# Patient Record
Sex: Male | Born: 1950 | Race: White | Hispanic: No | Marital: Single | Smoking: Former smoker
Health system: Southern US, Community
[De-identification: ages and names within clinical notes are randomized; demographics above are authoritative.]

## PROBLEM LIST (undated history)

## (undated) DIAGNOSIS — E7800 Pure hypercholesterolemia, unspecified: Secondary | ICD-10-CM

## (undated) DIAGNOSIS — J302 Other seasonal allergic rhinitis: Secondary | ICD-10-CM

---

## 2008-08-14 ENCOUNTER — Ambulatory Visit: Payer: Self-pay | Admitting: Unknown Physician Specialty

## 2011-02-22 ENCOUNTER — Ambulatory Visit: Payer: Self-pay | Admitting: Unknown Physician Specialty

## 2011-03-06 ENCOUNTER — Ambulatory Visit: Payer: Self-pay | Admitting: Unknown Physician Specialty

## 2011-04-05 ENCOUNTER — Ambulatory Visit: Payer: Self-pay | Admitting: Unknown Physician Specialty

## 2011-05-06 ENCOUNTER — Ambulatory Visit: Payer: Self-pay | Admitting: Unknown Physician Specialty

## 2011-06-05 ENCOUNTER — Ambulatory Visit: Payer: Self-pay | Admitting: Unknown Physician Specialty

## 2014-11-21 ENCOUNTER — Ambulatory Visit: Payer: Self-pay | Admitting: Internal Medicine

## 2015-04-14 ENCOUNTER — Encounter: Payer: Self-pay | Admitting: Emergency Medicine

## 2015-04-14 ENCOUNTER — Ambulatory Visit: Payer: BLUE CROSS/BLUE SHIELD

## 2015-04-14 ENCOUNTER — Ambulatory Visit
Admission: EM | Admit: 2015-04-14 | Discharge: 2015-04-14 | Disposition: A | Discharge status: Home / Self Care | Payer: BLUE CROSS/BLUE SHIELD | Attending: Family Medicine | Admitting: Family Medicine

## 2015-04-14 DIAGNOSIS — M545 Low back pain: Secondary | ICD-10-CM | POA: Diagnosis not present

## 2015-04-14 DIAGNOSIS — F17210 Nicotine dependence, cigarettes, uncomplicated: Secondary | ICD-10-CM | POA: Diagnosis not present

## 2015-04-14 DIAGNOSIS — S335XXA Sprain of ligaments of lumbar spine, initial encounter: Secondary | ICD-10-CM | POA: Diagnosis not present

## 2015-04-14 HISTORY — DX: Other seasonal allergic rhinitis: J30.2

## 2015-04-14 MED ORDER — KETOROLAC TROMETHAMINE 60 MG/2ML IM SOLN
60.0000 mg | Freq: Once | INTRAMUSCULAR | Status: AC
  Administered 2015-04-14: 60 mg via INTRAMUSCULAR

## 2015-04-14 MED ORDER — NAPROXEN 500 MG PO TABS: 500.0000 mg | ORAL_TABLET | Freq: Two times a day (BID) | ORAL | Status: AC

## 2015-04-14 MED ORDER — CYCLOBENZAPRINE HCL 10 MG PO TABS: 10.0000 mg | ORAL_TABLET | Freq: Three times a day (TID) | ORAL | Status: AC

## 2015-04-14 NOTE — Discharge Instructions (Signed)
Back Exercises °Back exercises help treat and prevent back injuries. The goal of back exercises is to increase the strength of your abdominal and back muscles and the flexibility of your back. These exercises should be started when you no longer have back pain. Back exercises include: °· Pelvic Tilt. Lie on your back with your knees bent. Tilt your pelvis until the lower part of your back is against the floor. Hold this position 5 to 10 sec and repeat 5 to 10 times. °· Knee to Chest. Pull first 1 knee up against your chest and hold for 20 to 30 seconds, repeat this with the other knee, and then both knees. This may be done with the other leg straight or bent, whichever feels better. °· Sit-Ups or Curl-Ups. Bend your knees 90 degrees. Start with tilting your pelvis, and do a partial, slow sit-up, lifting your trunk only 30 to 45 degrees off the floor. Take at least 2 to 3 seconds for each sit-up. Do not do sit-ups with your knees out straight. If partial sit-ups are difficult, simply do the above but with only tightening your abdominal muscles and holding it as directed. °· Hip-Lift. Lie on your back with your knees flexed 90 degrees. Push down with your feet and shoulders as you raise your hips a couple inches off the floor; hold for 10 seconds, repeat 5 to 10 times. °· Back arches. Lie on your stomach, propping yourself up on bent elbows. Slowly press on your hands, causing an arch in your low back. Repeat 3 to 5 times. Any initial stiffness and discomfort should lessen with repetition over time. °· Shoulder-Lifts. Lie face down with arms beside your body. Keep hips and torso pressed to floor as you slowly lift your head and shoulders off the floor. °Do not overdo your exercises, especially in the beginning. Exercises may cause you some mild back discomfort which lasts for a few minutes; however, if the pain is more severe, or lasts for more than 15 minutes, do not continue exercises until you see your caregiver.  Improvement with exercise therapy for back problems is slow.  °See your caregivers for assistance with developing a proper back exercise program. °Document Released: 12/29/2004 Document Revised: 02/13/2012 Document Reviewed: 09/22/2011 °ExitCare® Patient Information ©2015 [employer]. This information is not intended to replace advice given to you by your health care provider. Make sure you discuss any questions you have with your health care provider. ° °Back Pain, Adult °Back pain is very common. The pain often gets better over time. The cause of back pain is usually not dangerous. Most people can learn to manage their back pain on their own.  °HOME CARE  °· Stay active. Start with short walks on flat ground if you can. Try to walk farther each day. °· Do not sit, drive, or stand in one place for more than 30 minutes. Do not stay in bed. °· Do not avoid exercise or work. Activity can help your back heal faster. °· Be careful when you bend or lift an object. Bend at your knees, keep the object close to you, and do not twist. °· Sleep on a firm mattress. Lie on your side, and bend your knees. If you lie on your back, put a pillow under your knees. °· Only take medicines as told by your doctor. °· Put ice on the injured area. °¨ Put ice in a plastic bag. °¨ Place a towel between your skin and the bag. °¨ Leave the   ice on for 15-20 minutes, 03-04 times a day for the first 2 to 3 days. After that, you can switch between ice and heat packs. °· Ask your doctor about back exercises or massage. °· Avoid feeling anxious or stressed. Find good ways to deal with stress, such as exercise. °GET HELP RIGHT AWAY IF:  °· Your pain does not go away with rest or medicine. °· Your pain does not go away in 1 week. °· You have new problems. °· You do not feel well. °· The pain spreads into your legs. °· You cannot control when you poop (bowel movement) or pee (urinate). °· Your arms or legs feel weak or lose feeling  (numbness). °· You feel sick to your stomach (nauseous) or throw up (vomit). °· You have belly (abdominal) pain. °· You feel like you may pass out (faint). °MAKE SURE YOU:  °· Understand these instructions. °· Will watch your condition. °· Will get help right away if you are not doing well or get worse. °Document Released: 05/09/2008 Document Revised: 02/13/2012 Document Reviewed: 03/25/2014 °ExitCare® Patient Information ©2015 [employer]. This information is not intended to replace advice given to you by your health care provider. Make sure you discuss any questions you have with your health care provider. ° °

## 2015-04-14 NOTE — ED Notes (Signed)
Patient c/o lower back pain since Thursday of last week.  Patient denies injury.

## 2015-04-14 NOTE — ED Provider Notes (Addendum)
CSN: [id number]     Arrival date & time 04/14/15  8466 History   First MD Initiated Contact with Patient 04/14/15 1010     Chief Complaint  Patient presents with  . Back Pain   (Consider location/radiation/quality/duration/timing/severity/associated sxs/prior Treatment) HPI  this is a 64 year old male who presents with intermittent left-sided low back pain that first occurred on Thursday 4 days prior to this visit. 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 He has initial back pain on Friday for which he took 1 dose of ibuprofen and felt "fine for 2 days". On Sunday he began to again feel the pain this time radiating into his left buttocks. He took ibuprofen again but did not help. The pain has progressed and yesterday was the worst pain he has experienced. He's had several episodes of a lightning type pain in his posterior thigh to the level of his knee. He denies any symptoms below his knee. He denies any incontinence. He has had previous back pain episodes  which resolved with a few days of rest. Past Medical History  Diagnosis Date  . Seasonal allergies    History reviewed. No pertinent past surgical history. History reviewed. No pertinent family history. History  Substance Use Topics  . Smoking status: Current Every Day Smoker    Types: Cigarettes  . Smokeless tobacco: Never Used  . Alcohol Use: Yes    Review of Systems  Constitutional: Positive for activity change.  Musculoskeletal: Positive for myalgias and back pain.  All other systems reviewed and are negative.   Allergies  Review of patient's allergies indicates no known allergies.  Home Medications   Prior to Admission medications   Medication Sig Start Date End Date Taking? Authorizing Provider  loratadine (CLARITIN) 10 MG tablet Take 10 mg by mouth daily.   Yes  Historical Provider, MD  cyclobenzaprine (FLEXERIL) 10 MG tablet Take 1 tablet (10 mg total) by mouth 3 (three) times daily. 04/14/15   Lorin Picket, PA-C  naproxen (NAPROSYN) 500 MG tablet Take 1 tablet (500 mg total) by mouth 2 (two) times daily with a meal. 04/14/15   Lorin Picket, PA-C   BP 148/88 mmHg  Pulse 68  Temp(Src) 96.3 F (35.7 C) (Tympanic)  Resp 16  Ht 5\' 9"  (1.753 m)  Wt 240 lb (108.863 kg)  BMI 35.43 kg/m2  SpO2 96% Physical Exam  Constitutional: He is oriented to person, place, and time. He appears well-developed and well-nourished.  HENT:  Head: Normocephalic and atraumatic.  Eyes: EOM are normal. Pupils are equal, round, and reactive to light.  Neck: Normal range of motion. Neck supple.  Musculoskeletal:  Examination lumbar spine shows a level pelvis in stance. He has a definite right list and a marked right takeoff of the lumbar spine. Forward flexion and right lateral flexion are normal liver left lateral flexion is markedly blunted. Is able to toe and heel walk adequately. DTRs are brisk 3+ over 4 bilaterally symmetrical. Sensation is intact to light touch in the lower extremities. Straight leg raise testing in the sitting position is negative to 90. EHL anterior tibialis and peroneal are strong. There is tenderness to palpation in the left paraspinous muscles in the lower segments as well as in the left sciatic notch.  Neurological: He is alert and oriented to person,  place, and time. He has normal reflexes.  Skin: Skin is warm and dry.  Psychiatric: He has a normal mood and affect. His behavior is normal. Judgment and thought content normal.    ED Course  Procedures (including critical care time) Labs Review Labs Reviewed - No data to display  Imaging Review No results found. Medications  ketorolac (TORADOL) injection 60 mg (60 mg Intramuscular Given 04/14/15 1040)    MDM   1. Lumbar back sprain, initial encounter     I discussed with the  patient the findings of the x-rays. I advised him that I'll place him on the above work restrictions. 2 weeks. Medications have been prescribed. Is also advised that it may require physical therapy or even consideration of chiropractic care. Since he does not have a primary care physician at the present time he may return here for follow-up. Is currently in the process of changing insurance companies and will be established with [facility] at that time.       Lorin Picket, PA-C 04/14/15 [address] [place], [place] 05/07/15 1432

## 2016-06-09 ENCOUNTER — Encounter: Payer: Self-pay | Admitting: *Deleted

## 2016-06-09 NOTE — Progress Notes (Signed)
This encounter was created in error - please disregard.

## 2020-12-21 ENCOUNTER — Ambulatory Visit
Admission: EM | Admit: 2020-12-21 | Discharge: 2020-12-21 | Disposition: A | Discharge status: Home / Self Care | Payer: 59 | Attending: Physician Assistant | Admitting: Physician Assistant

## 2020-12-21 ENCOUNTER — Other Ambulatory Visit: Payer: Self-pay

## 2020-12-21 ENCOUNTER — Ambulatory Visit (INDEPENDENT_AMBULATORY_CARE_PROVIDER_SITE_OTHER): Payer: 59

## 2020-12-21 DIAGNOSIS — R197 Diarrhea, unspecified: Secondary | ICD-10-CM | POA: Diagnosis not present

## 2020-12-21 DIAGNOSIS — Z8616 Personal history of COVID-19: Secondary | ICD-10-CM

## 2020-12-21 DIAGNOSIS — U071 COVID-19: Secondary | ICD-10-CM | POA: Diagnosis not present

## 2020-12-21 LAB — COMPREHENSIVE METABOLIC PANEL
ALT: 26 U/L (ref 0–44)
AST: 25 U/L (ref 15–41)
Albumin: 3.8 g/dL (ref 3.5–5.0)
Alkaline Phosphatase: 64 U/L (ref 38–126)
Anion gap: 14 (ref 5–15)
BUN: 23 mg/dL (ref 8–23)
CO2: 26 mmol/L (ref 22–32)
Calcium: 9 mg/dL (ref 8.9–10.3)
Chloride: 101 mmol/L (ref 98–111)
Creatinine, Ser: 1.39 mg/dL — ABNORMAL HIGH (ref 0.61–1.24)
GFR, Estimated: 55 mL/min — ABNORMAL LOW (ref >60)
Glucose, Bld: 119 mg/dL — ABNORMAL HIGH (ref 70–99)
Potassium: 3.7 mmol/L (ref 3.5–5.1)
Sodium: 141 mmol/L (ref 135–145)
Total Bilirubin: 0.4 mg/dL (ref 0.3–1.2)
Total Protein: 8 g/dL (ref 6.5–8.1)

## 2020-12-21 LAB — CBC WITH DIFFERENTIAL/PLATELET
Abs Immature Granulocytes: 0.04 10*3/uL (ref 0.00–0.07)
Basophils Absolute: 0 10*3/uL (ref 0.0–0.1)
Basophils Relative: 0 %
Eosinophils Absolute: 0.2 10*3/uL (ref 0.0–0.5)
Eosinophils Relative: 3 %
HCT: 49.6 % (ref 39.0–52.0)
Hemoglobin: 16.7 g/dL (ref 13.0–17.0)
Immature Granulocytes: 0 %
Lymphocytes Relative: 14 %
Lymphs Abs: 1.3 10*3/uL (ref 0.7–4.0)
MCH: 31.6 pg (ref 26.0–34.0)
MCHC: 33.7 g/dL (ref 30.0–36.0)
MCV: 93.9 fL (ref 80.0–100.0)
Monocytes Absolute: 1.2 10*3/uL — ABNORMAL HIGH (ref 0.1–1.0)
Monocytes Relative: 13 %
Neutro Abs: 6.4 10*3/uL (ref 1.7–7.7)
Neutrophils Relative %: 70 %
Platelets: 291 10*3/uL (ref 150–400)
RBC: 5.28 MIL/uL (ref 4.22–5.81)
RDW: 12.8 % (ref 11.5–15.5)
WBC: 9.2 10*3/uL (ref 4.0–10.5)
nRBC: 0 % (ref 0.0–0.2)

## 2020-12-21 MED ORDER — DIPHENOXYLATE-ATROPINE 2.5-0.025 MG PO TABS: 1.0000 | ORAL_TABLET | Freq: Four times a day (QID) | ORAL | 0 refills | Status: DC | PRN

## 2020-12-21 NOTE — ED Triage Notes (Signed)
Pt with diarrhea post COVID last week. States the stool has solid and loose components. No nausea or vomiting. Eating and drinking mostly normally.

## 2020-12-21 NOTE — Discharge Instructions (Signed)
Your chest x-ray is within normal limits.  There is no evidence of pneumonia.  We also performed some lab tests which are reassuring.  As discussed, your kidney function is slightly decreased from what it was 2 months ago when you had your labs checked your primary care office.  I will follow up with them once you are feeling better to have this lab rechecked.  Rest and increase fluid intake.  I have sent Lomotil to the pharmacy for diarrhea.  If you have any fever, dark tarry stools, blood in the stool, abdominal pain, nausea/vomiting or weakness, follow back up with her department to go to emergency room.  As discussed, there are many different causes for diarrhea including viral infection such as COVID-19 since he recently had that.  There are also other causes which can include bacterial or parasitic infections, and abdominal infections.  It is most likely symptoms are related to recent COVID-19 infection.

## 2020-12-21 NOTE — ED Provider Notes (Signed)
[facility]    CSN: [id number] Arrival date & time: 12/21/20  1312      History   Chief Complaint Chief Complaint  Patient presents with  . Diarrhea    HPI Adam Brewer is a 70 y.o. male presenting for concerns about diarrhea. Patient states that he was diagnosed with COVID 19 11 days ago.  He states that his symptoms were not that bad.  He admitted to a mild cough but states that he has not had a continued cough.  Patient states that about 5 to 6 days ago he started having diarrhea.  Patient states that initially he was having loose and watery stools every hour.  He states that in the past 24 hours he has had about 10 loose stools.  Patient denies any dark black stools or blood in the stool.  He denies any associated abdominal pain, nausea/vomiting.  He says he has been tolerating food and fluids, states he had 2 egg sandwiches today. Denies any fevers or fatigue.  He says that he took Imodium a couple of times but has not taken it recently.  Patient says he supposed to go back to work tomorrow and does not know if he should.  Patient denies any history of GI problems.  Denies any recent antibiotic use or travel.  Patient denies any weakness, headaches, congestion, chest pain, shortness of breath, back pain.  Has not taken any other over-the-counter medication for symptoms.  PMH significant for smoking >50 years, quit 1 year ago.  Patient denies any history of cardiopulmonary disease.  He says he is otherwise healthy.  No other complaints or concerns.  HPI  Past Medical History:  Diagnosis Date  . Seasonal allergies     There are no problems to display for this patient.   History reviewed. No pertinent surgical history.     Home Medications    Prior to Admission medications   Medication Sig Start Date End Date Taking? Authorizing Provider  diphenoxylate-atropine (LOMOTIL) 2.5-0.025 MG tablet Take 1 tablet by mouth 4 (four) times daily as needed for up to 5  days for diarrhea or loose stools. 12/21/20 12/26/20 Yes Danton Clap, PA-C  cyclobenzaprine (FLEXERIL) 10 MG tablet Take 1 tablet (10 mg total) by mouth 3 (three) times daily. 04/14/15   Lorin Picket, PA-C  loratadine (CLARITIN) 10 MG tablet Take 10 mg by mouth daily.    [provider]  naproxen (NAPROSYN) 500 MG tablet Take 1 tablet (500 mg total) by mouth 2 (two) times daily with a meal. 04/14/15   Lorin Picket, PA-C    Family History History reviewed. No pertinent family history.  Social History Social History   Tobacco Use  . Smoking status: Former Smoker    Types: Cigarettes  . Smokeless tobacco: Never Used  Substance Use Topics  . Alcohol use: Yes    Comment: social  . Drug use: No     Allergies   Patient has no known allergies.   Review of Systems Review of Systems  Constitutional: Negative for activity change, appetite change, fatigue and fever.  HENT: Negative for congestion, rhinorrhea and sore throat.   Respiratory: Negative for cough and shortness of breath.   Cardiovascular: Negative for chest pain.  Gastrointestinal: Positive for diarrhea. Negative for abdominal pain, anal bleeding, blood in stool, constipation, nausea, rectal pain and vomiting.  Genitourinary: Negative for decreased urine volume and dysuria.  Musculoskeletal: Negative for back pain and myalgias.  Skin: Negative  for rash.  Neurological: Negative for weakness, light-headedness and headaches.     Physical Exam Triage Vital Signs ED Triage Vitals  Enc Vitals Group     BP 12/21/20 1411 138/78     Pulse Rate 12/21/20 1411 85     Resp 12/21/20 1411 19     Temp 12/21/20 1411 98.7 F (37.1 C)     Temp Source 12/21/20 1411 Oral     SpO2 12/21/20 1411 94 %     Weight 12/21/20 1409 260 lb (117.9 kg)     Height 12/21/20 1409 5\' 8"  (1.727 m)     Head Circumference --      Peak Flow --      Pain Score 12/21/20 1409 3     Pain Loc --      Pain Edu? --      Excl. in [place]? --     No data found.  Updated Vital Signs BP 138/78 (BP Location: Left Arm)   Pulse 85   Temp 98.7 F (37.1 C) (Oral)   Resp 19   Ht 5\' 8"  (1.727 m)   Wt 260 lb (117.9 kg)   SpO2 94%   BMI 39.53 kg/m       Physical Exam Vitals and nursing note reviewed.  Constitutional:      General: He is not in acute distress.    Appearance: Normal appearance. He is well-developed and well-nourished. He is not ill-appearing, toxic-appearing or diaphoretic.  HENT:     Head: Normocephalic and atraumatic.     Nose: Nose normal.     Mouth/Throat:     Mouth: Mucous membranes are moist.     Pharynx: Oropharynx is clear.  Eyes:     General: No scleral icterus.    Conjunctiva/sclera: Conjunctivae normal.  Cardiovascular:     Rate and Rhythm: Normal rate and regular rhythm.     Heart sounds: Normal heart sounds.  Pulmonary:     Effort: Pulmonary effort is normal. No respiratory distress.     Breath sounds: Normal breath sounds. No wheezing, rhonchi or rales.  Abdominal:     General: Bowel sounds are normal.     Palpations: Abdomen is soft.     Tenderness: There is no abdominal tenderness. There is no guarding.  Musculoskeletal:        General: No edema.     Cervical back: Neck supple.  Skin:    General: Skin is warm and dry.  Neurological:     General: No focal deficit present.     Mental Status: He is alert. Mental status is at baseline.     Motor: No weakness.     Gait: Gait normal.  Psychiatric:        Mood and Affect: Mood and affect and mood normal.        Behavior: Behavior normal.        Thought Content: Thought content normal.      UC Treatments / Results  Labs (all labs ordered are listed, but only abnormal results are displayed) Labs Reviewed  COMPREHENSIVE METABOLIC PANEL - Abnormal; Notable for the following components:      Result Value   Glucose, Bld 119 (*)    Creatinine, Ser 1.39 (*)    GFR, Estimated 55 (*)    All other components within normal limits  CBC  WITH DIFFERENTIAL/PLATELET - Abnormal; Notable for the following components:   Monocytes Absolute 1.2 (*)    All other components within normal limits  EKG   Radiology DG Chest 2 View  Result Date: 12/21/2020 CLINICAL DATA:  History of prior COVID infection several days ago with hypoxia EXAM: CHEST - 2 VIEW COMPARISON:  None. FINDINGS: The heart size and mediastinal contours are within normal limits. Both lungs are clear. The visualized skeletal structures are unremarkable. IMPRESSION: No active cardiopulmonary disease. Electronically Signed   By: Inez Catalina M.D.   On: 12/21/2020 15:10    Procedures Procedures (including critical care time)  Medications Ordered in UC Medications - No data to display  Initial Impression / Assessment and Plan / UC Course  I have reviewed the triage vital signs and the nursing notes.  Pertinent labs & imaging results that were available during my care of the patient were reviewed by me and considered in my medical decision making (see chart for details).   70 year old male presenting for 5 to 6-day history of diarrhea which has been improving over the past day.  He still admits to greater than 10 episodes of diarrhea per day.  Denies any other associated symptoms.  He admits to diagnosis of COVID-19 about a week and a half ago.  Patient's vital signs are stable although his oxygen saturation is 94%.  Patient denies any shortness of breath, chest pain or wheezing.  He does admit to being a former smoker for over 50 years.  Denies any history of COPD or other breathing issues.  Patient says he has not had a continued cough and feels okay.  His abdominal exam is within normal limits.  There is no tenderness and bowel sounds are normal.    Chest x-ray performed due to decreased oxygen saturation and chest x-ray is within normal limits as read by radiologist.  Lab work also obtained today due to concerns about prolonged diarrhea.  Glucose slightly elevated  at 119.  Creatinine elevated at 1.3 and GFR decreased to 55.  CBC is normal.  I was able to access lab results from November 2021 which shows a creatinine of 1.2.  Patient denies CKD.  Patient states that he is still having normal urine output and has been drinking well.  Advised him to follow-up with PCP to have this lab repeated.  Advised patient that the diarrhea is likely due to COVID-19 virus and should get better over the next few days.  Advised him that there are other causes that could be investigated as well but he would need to leave a stool sample and he could always bring that back.  Patient declined to do that at this time and states he would just like something for diarrhea and a couple more days out of work.  I have sent Lomotil to pharmacy encouraged him to increase his rest and fluid intake.  Red flag symptoms discussed with patient.  ED precautions reviewed.   Final Clinical Impressions(s) / UC Diagnoses   Final diagnoses:  Diarrhea, unspecified type  Personal history of COVID-19     Discharge Instructions     Your chest x-ray is within normal limits.  There is no evidence of pneumonia.  We also performed some lab tests which are reassuring.  As discussed, your kidney function is slightly decreased from what it was 2 months ago when you had your labs checked your primary care office.  I will follow up with them once you are feeling better to have this lab rechecked.  Rest and increase fluid intake.  I have sent Lomotil to the pharmacy for diarrhea.  If you  have any fever, dark tarry stools, blood in the stool, abdominal pain, nausea/vomiting or weakness, follow back up with her department to go to emergency room.  As discussed, there are many different causes for diarrhea including viral infection such as COVID-19 since he recently had that.  There are also other causes which can include bacterial or parasitic infections, and abdominal infections.  It is most likely symptoms are  related to recent COVID-19 infection.     ED Prescriptions    Medication Sig Dispense Auth. Provider   diphenoxylate-atropine (LOMOTIL) 2.5-0.025 MG tablet Take 1 tablet by mouth 4 (four) times daily as needed for up to 5 days for diarrhea or loose stools. 20 tablet Gretta Cool     PDMP not reviewed this encounter.   Danton Clap, PA-C 12/21/20 [phone]

## 2020-12-25 ENCOUNTER — Ambulatory Visit
Admission: EM | Admit: 2020-12-25 | Discharge: 2020-12-25 | Disposition: A | Discharge status: Home / Self Care | Payer: 59 | Attending: Family Medicine | Admitting: Family Medicine

## 2020-12-25 ENCOUNTER — Other Ambulatory Visit: Payer: Self-pay

## 2020-12-25 DIAGNOSIS — R197 Diarrhea, unspecified: Secondary | ICD-10-CM | POA: Insufficient documentation

## 2020-12-25 HISTORY — DX: Pure hypercholesterolemia, unspecified: E78.00

## 2020-12-25 LAB — GASTROINTESTINAL PANEL BY PCR, STOOL (REPLACES STOOL CULTURE)

## 2020-12-25 LAB — C DIFFICILE QUICK SCREEN W PCR REFLEX
C Diff antigen: NEGATIVE
C Diff interpretation: NOT DETECTED
C Diff toxin: NEGATIVE

## 2020-12-25 MED ORDER — DIPHENOXYLATE-ATROPINE 2.5-0.025 MG PO TABS: 2.0000 | ORAL_TABLET | Freq: Four times a day (QID) | ORAL | 0 refills | Status: DC | PRN

## 2020-12-25 MED ORDER — DIPHENOXYLATE-ATROPINE 2.5-0.025 MG PO TABS: 2.0000 | ORAL_TABLET | Freq: Four times a day (QID) | ORAL | 0 refills | Status: AC | PRN

## 2020-12-25 NOTE — ED Triage Notes (Signed)
Pt presents ongoing diarrhea.  States it is normal looking diarrhea with no blood or mucous, not overly water.  States urine is looking darker.  Has increased fluid intake including pedialyte.  Went 8 times yesterday and two times already today.  Sometimes a little abdominal pain with the diarrhea. Not TTP.

## 2020-12-25 NOTE — ED Provider Notes (Signed)
[facility]    CSN: [id number] Arrival date & time: 12/25/20  [id number]      History   Chief Complaint Chief Complaint  Patient presents with  . Diarrhea    HPI Monte Nordhoff is a 70 y.o. male presenting for diarrhea for ~9-10 days following COVID 19 infection. Patient seen in clinic 12/22/2019. He had a CXR and CBC and CMP done at that time. He says he has been taking Lomotil as prescribed and says he continues to have >5 episodes of diarrhea per day. This is decreased from 10. He says he occasionally has good says where he does not have as much diarrhea and then bad days where he has more episodes. States he had 8 episodes yesterday.  He says he is already had 2 episodes of diarrhea today.  He continues to deny any associated fever, dark stools or bloody stools, abdominal pain, nausea/vomiting or appetite changes.  He states he is still eating and drinking normally.  Can not identify any trigger foods.  No history of GI issues.  Patient says he does have a PCP in [place], but would like to have a PCP closer and request information about PCP at med center [facility].  He has no other concerns today.  HPI  Past Medical History:  Diagnosis Date  . High cholesterol   . Seasonal allergies     There are no problems to display for this patient.   History reviewed. No pertinent surgical history.     Home Medications    Prior to Admission medications   Medication Sig Start Date End Date Taking? Authorizing Provider  UNKNOWN TO PATIENT Cholesterol pill.  "Itty bitty pill"  possibly 5mg    Yes [provider]  cyclobenzaprine (FLEXERIL) 10 MG tablet Take 1 tablet (10 mg total) by mouth 3 (three) times daily. 04/14/15   Lorin Picket, PA-C  diphenoxylate-atropine (LOMOTIL) 2.5-0.025 MG tablet Take 2 tablets by mouth 4 (four) times daily as needed for up to 5 days for diarrhea or loose stools. 12/25/20 12/30/20  Laurene Footman B, PA-C  loratadine (CLARITIN) 10 MG  tablet Take 10 mg by mouth daily.    [provider]  naproxen (NAPROSYN) 500 MG tablet Take 1 tablet (500 mg total) by mouth 2 (two) times daily with a meal. 04/14/15   Lorin Picket, PA-C    Family History History reviewed. No pertinent family history.  Social History Social History   Tobacco Use  . Smoking status: Former Smoker    Types: Cigarettes  . Smokeless tobacco: Never Used  Vaping Use  . Vaping Use: Never used  Substance Use Topics  . Alcohol use: Yes    Comment: social  . Drug use: No     Allergies   Patient has no known allergies.   Review of Systems Review of Systems  Constitutional: Negative for appetite change, fatigue and fever.  Respiratory: Negative for cough and shortness of breath.   Cardiovascular: Negative for chest pain.  Gastrointestinal: Positive for diarrhea. Negative for abdominal pain, blood in stool, constipation, nausea, rectal pain and vomiting.  Genitourinary: Negative for difficulty urinating.  Musculoskeletal: Negative for back pain and myalgias.  Neurological: Negative for weakness.     Physical Exam Triage Vital Signs ED Triage Vitals  Enc Vitals Group     BP 12/25/20 0955 131/80     Pulse Rate 12/25/20 0955 84     Resp 12/25/20 0955 18     Temp 12/25/20 0955  98 F (36.7 C)     Temp Source 12/25/20 0955 Oral     SpO2 12/25/20 0955 97 %     Weight --      Height --      Head Circumference --      Peak Flow --      Pain Score 12/25/20 0949 0     Pain Loc --      Pain Edu? --      Excl. in [place]? --    No data found.  Updated Vital Signs BP 131/80 (BP Location: Left Arm)   Pulse 84   Temp 98 F (36.7 C) (Oral)   Resp 18   SpO2 97%       Physical Exam Vitals and nursing note reviewed.  Constitutional:      General: He is not in acute distress.    Appearance: Normal appearance. He is well-developed and well-nourished. He is not ill-appearing or toxic-appearing.  HENT:     Head: Normocephalic and  atraumatic.  Eyes:     General: No scleral icterus.    Conjunctiva/sclera: Conjunctivae normal.  Cardiovascular:     Rate and Rhythm: Normal rate and regular rhythm.     Heart sounds: Normal heart sounds.  Pulmonary:     Effort: Pulmonary effort is normal. No respiratory distress.     Breath sounds: Normal breath sounds.  Abdominal:     General: Bowel sounds are normal.     Palpations: Abdomen is soft.     Tenderness: There is no abdominal tenderness. There is no guarding.  Musculoskeletal:        General: No edema.     Cervical back: Neck supple.  Skin:    General: Skin is warm and dry.  Neurological:     General: No focal deficit present.     Mental Status: He is alert. Mental status is at baseline.     Motor: No weakness.     Gait: Gait normal.  Psychiatric:        Mood and Affect: Mood and affect and mood normal.        Behavior: Behavior normal.        Thought Content: Thought content normal.      UC Treatments / Results  Labs (all labs ordered are listed, but only abnormal results are displayed) Labs Reviewed - No data to display  EKG   Radiology No results found.  Procedures Procedures (including critical care time)  Medications Ordered in UC Medications - No data to display  Initial Impression / Assessment and Plan / UC Course  I have reviewed the triage vital signs and the nursing notes.  Pertinent labs & imaging results that were available during my care of the patient were reviewed by me and considered in my medical decision making (see chart for details).   70 year old male returning for diarrhea.  I did see him at his previous visit.  In the clinic today, he is well-appearing and all vital signs are normal and stable.  His exam is within normal limits and he has absolutely no abdominal tenderness.  At this point, advised patient to return with stool samples.  Ordered GI panel, O&P, and C. difficile.  Advised him that there is possibility he could  have an infection and if he does we should be able to see that with the stool samples and we can treat him accordingly.  Advised him to increase rest and fluids at this time.  Advised  to continue the Lomotil for the diarrhea.  I did increase the dose a little bit.  Advised him to add fiber to his diet and printed list of foods to help relieve diarrhea.  Advised patient that if we do not get any answers with the stool studies then he should follow-up with PCP (provided him with contact information for meeting primary care upstairs) for further work-up including possible CT scan or GI referral.  Patient agrees.  He does mention that he is due for colonoscopy sometime this year or next year.  Again, reviewed ED precautions with patient.  Final Clinical Impressions(s) / UC Diagnoses   Final diagnoses:  Diarrhea, unspecified type     Discharge Instructions     Increase fluids and rest. Start taking daily fiber supplement and increasing dietary fiber.   Return the stool samples at earliest convenience so we can see if you have an infection.   Establish with PCP upstairs. [facility] at [facility], [address]. Phone number: [phone] Please call this number to establish with primary care   Go to ED for pain, dark stools, blood in stool, weakness, dehydration    ED Prescriptions    Medication Sig Dispense Auth. Provider   diphenoxylate-atropine (LOMOTIL) 2.5-0.025 MG tablet  (Status: Discontinued) Take 2 tablets by mouth 4 (four) times daily as needed for up to 5 days for diarrhea or loose stools. 30 tablet Gretta Cool   diphenoxylate-atropine (LOMOTIL) 2.5-0.025 MG tablet Take 2 tablets by mouth 4 (four) times daily as needed for up to 5 days for diarrhea or loose stools. 30 tablet Gretta Cool     PDMP not reviewed this encounter.   Danton Clap, PA-C 12/25/20 1058

## 2020-12-25 NOTE — Discharge Instructions (Signed)
Increase fluids and rest. Start taking daily fiber supplement and increasing dietary fiber.   Return the stool samples at earliest convenience so we can see if you have an infection.   Establish with PCP upstairs. [facility], [address], [address]. Phone number: [phone] Please call this number to establish with primary care   Go to ED for pain, dark stools, blood in stool, weakness, dehydration

## 2022-02-13 IMAGING — CR DG CHEST 2V
2 series · 2 of 2 positions shown · non-contrast
Comparison: None.

CLINICAL DATA: History of prior COVID infection several days ago
with hypoxia

EXAM:
CHEST - 2 VIEW

[chest pa]
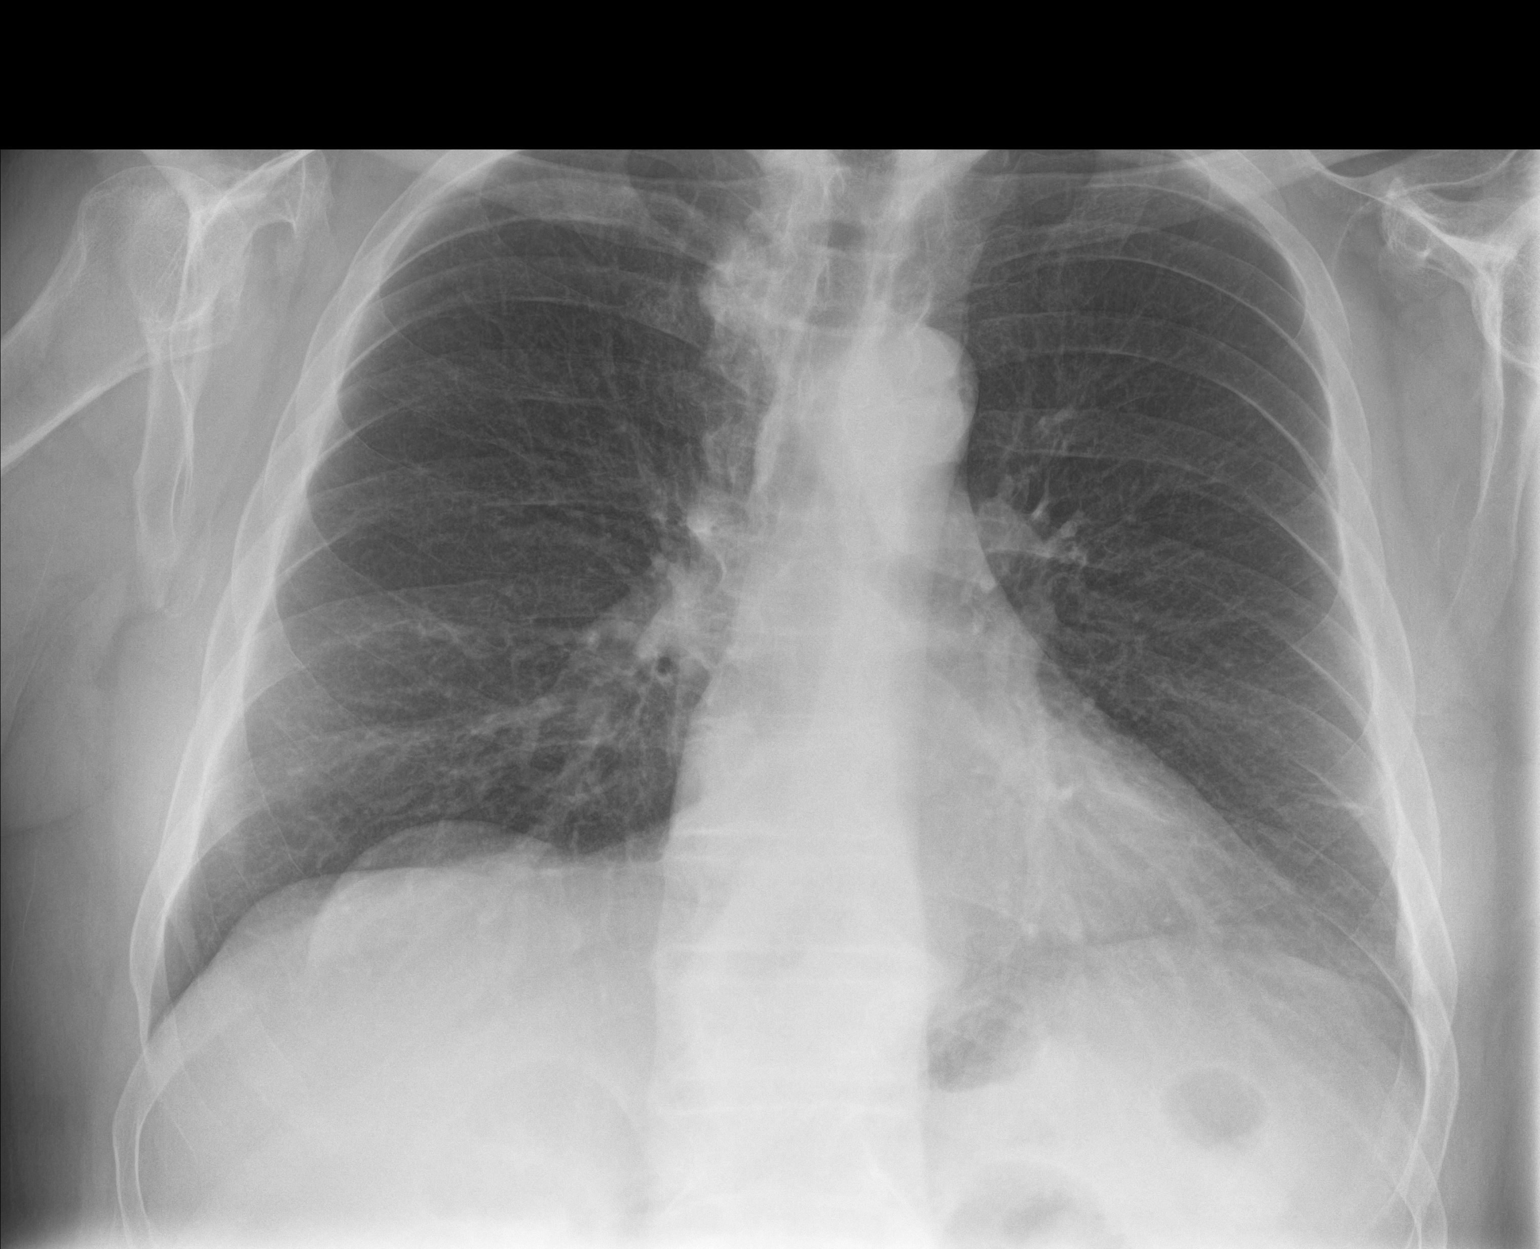

[chest lat]
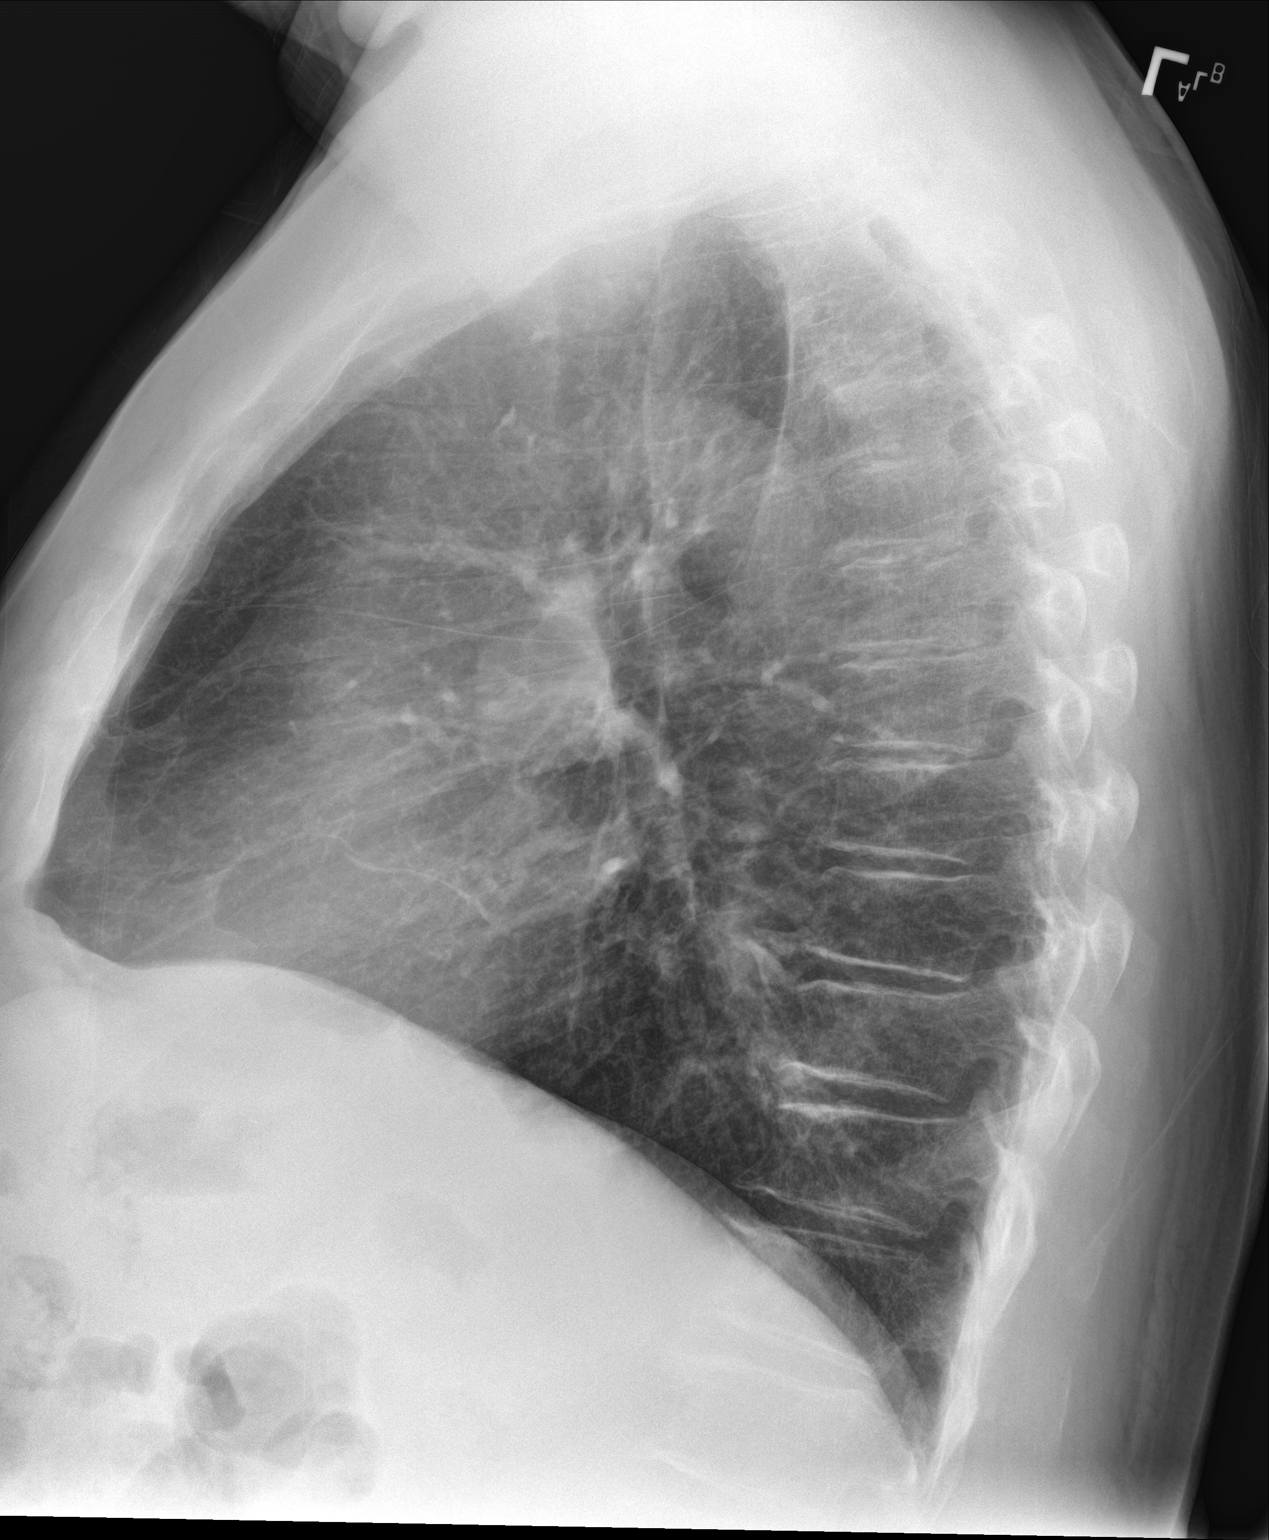

[2 of 2 positions shown; findings below may reference images not displayed]

FINDINGS: The heart size and mediastinal contours are within normal limits.
Both lungs are clear. The visualized skeletal structures are
unremarkable.
IMPRESSION: No active cardiopulmonary disease.

## 2022-12-19 ENCOUNTER — Encounter: Payer: Self-pay | Admitting: *Deleted

## 2022-12-20 ENCOUNTER — Ambulatory Visit: Payer: 59 | Admitting: Anesthesiology

## 2022-12-20 ENCOUNTER — Ambulatory Visit
Admission: RE | Admit: 2022-12-20 | Discharge: 2022-12-20 | Disposition: A | Discharge status: Home / Self Care | Payer: 59 | Attending: Gastroenterology | Admitting: Gastroenterology

## 2022-12-20 ENCOUNTER — Other Ambulatory Visit: Payer: Self-pay

## 2022-12-20 ENCOUNTER — Encounter
Admission: RE | Disposition: A | Discharge status: Home / Self Care | Payer: Self-pay | Source: Home / Self Care | Attending: Gastroenterology

## 2022-12-20 ENCOUNTER — Encounter: Payer: Self-pay | Admitting: *Deleted

## 2022-12-20 DIAGNOSIS — Z86010 Personal history of colonic polyps: Secondary | ICD-10-CM | POA: Insufficient documentation

## 2022-12-20 DIAGNOSIS — K449 Diaphragmatic hernia without obstruction or gangrene: Secondary | ICD-10-CM | POA: Diagnosis not present

## 2022-12-20 DIAGNOSIS — Z800 Family history of malignant neoplasm of digestive organs: Secondary | ICD-10-CM | POA: Diagnosis not present

## 2022-12-20 DIAGNOSIS — R1013 Epigastric pain: Secondary | ICD-10-CM | POA: Insufficient documentation

## 2022-12-20 DIAGNOSIS — Z87891 Personal history of nicotine dependence: Secondary | ICD-10-CM | POA: Diagnosis not present

## 2022-12-20 DIAGNOSIS — D123 Benign neoplasm of transverse colon: Secondary | ICD-10-CM | POA: Diagnosis not present

## 2022-12-20 DIAGNOSIS — K5730 Diverticulosis of large intestine without perforation or abscess without bleeding: Secondary | ICD-10-CM | POA: Diagnosis not present

## 2022-12-20 DIAGNOSIS — Z1211 Encounter for screening for malignant neoplasm of colon: Secondary | ICD-10-CM | POA: Diagnosis not present

## 2022-12-20 DIAGNOSIS — Z6841 Body Mass Index (BMI) 40.0 and over, adult: Secondary | ICD-10-CM | POA: Diagnosis not present

## 2022-12-20 DIAGNOSIS — K640 First degree hemorrhoids: Secondary | ICD-10-CM | POA: Diagnosis not present

## 2022-12-20 DIAGNOSIS — K2960 Other gastritis without bleeding: Secondary | ICD-10-CM | POA: Insufficient documentation

## 2022-12-20 DIAGNOSIS — K635 Polyp of colon: Secondary | ICD-10-CM | POA: Insufficient documentation

## 2022-12-20 HISTORY — PX: ESOPHAGOGASTRODUODENOSCOPY (EGD) WITH PROPOFOL: SHX5813

## 2022-12-20 HISTORY — PX: COLONOSCOPY WITH PROPOFOL: SHX5780

## 2022-12-20 SURGERY — COLONOSCOPY WITH PROPOFOL
Anesthesia: General

## 2022-12-20 MED ORDER — GLYCOPYRROLATE 0.2 MG/ML IJ SOLN
INTRAMUSCULAR | Status: DC | PRN
  Administered 2022-12-20: .1 mg via INTRAVENOUS

## 2022-12-20 MED ORDER — PHENYLEPHRINE 80 MCG/ML (10ML) SYRINGE FOR IV PUSH (FOR BLOOD PRESSURE SUPPORT)
PREFILLED_SYRINGE | INTRAVENOUS | Status: DC | PRN
  Administered 2022-12-20: 80 ug via INTRAVENOUS

## 2022-12-20 MED ORDER — PROPOFOL 500 MG/50ML IV EMUL
INTRAVENOUS | Status: DC | PRN
  Administered 2022-12-20: 200 ug/kg/min via INTRAVENOUS

## 2022-12-20 MED ORDER — PROPOFOL 10 MG/ML IV BOLUS
INTRAVENOUS | Status: AC
  Filled 2022-12-20: qty 40

## 2022-12-20 MED ORDER — LIDOCAINE HCL (CARDIAC) PF 100 MG/5ML IV SOSY
PREFILLED_SYRINGE | INTRAVENOUS | Status: DC | PRN
  Administered 2022-12-20 (×2): 50 mg via INTRAVENOUS

## 2022-12-20 MED ORDER — SODIUM CHLORIDE 0.9 % IV SOLN: INTRAVENOUS | Status: DC | PRN

## 2022-12-20 MED ORDER — GLYCOPYRROLATE 0.2 MG/ML IJ SOLN
INTRAMUSCULAR | Status: AC
  Filled 2022-12-20: qty 1

## 2022-12-20 MED ORDER — PROPOFOL 10 MG/ML IV BOLUS
INTRAVENOUS | Status: DC | PRN
  Administered 2022-12-20 (×2): 50 mg via INTRAVENOUS

## 2022-12-20 MED ORDER — DEXMEDETOMIDINE HCL IN NACL 80 MCG/20ML IV SOLN
INTRAVENOUS | Status: DC | PRN
  Administered 2022-12-20: 4 ug via BUCCAL
  Administered 2022-12-20: 8 ug via BUCCAL

## 2022-12-20 MED ORDER — LIDOCAINE HCL (PF) 2 % IJ SOLN
INTRAMUSCULAR | Status: AC
  Filled 2022-12-20: qty 5

## 2022-12-20 MED ORDER — PHENYLEPHRINE 80 MCG/ML (10ML) SYRINGE FOR IV PUSH (FOR BLOOD PRESSURE SUPPORT)
PREFILLED_SYRINGE | INTRAVENOUS | Status: AC
  Filled 2022-12-20: qty 10

## 2022-12-20 NOTE — Op Note (Signed)
[facility] Patient Name: Adam Brewer Procedure Date: 12/20/2022 9:30 AM MRN: [id number] Account #: [id number] Date of Birth: 06-11-1951 Admit Type: Outpatient Age: 72 Room: [facility] ENDO ROOM 1 Gender: Male Note Status: Finalized Instrument Name: Park Meo [id number] Procedure:             Colonoscopy Indications:           Surveillance: Personal history of adenomatous polyps                         on last colonoscopy 5 years ago Providers:             Andrey Farmer MD, MD Referring MD:          Philis Fendt PA Medicines:             Monitored Anesthesia Care Complications:         No immediate complications. Estimated blood loss:                         Minimal. Procedure:             Pre-Anesthesia Assessment:                        - Prior to the procedure, a History and Physical was                         performed, and patient medications and allergies were                         reviewed. The patient is competent. The risks and                         benefits of the procedure and the sedation options and                         risks were discussed with the patient. All questions                         were answered and informed consent was obtained.                         Patient identification and proposed procedure were                         verified by the physician, the nurse, the                         anesthesiologist, the anesthetist and the technician                         in the endoscopy suite. Mental Status Examination:                         alert and oriented. Airway Examination: normal                         oropharyngeal airway and neck mobility. Respiratory  Examination: clear to auscultation. CV Examination:                         normal. Prophylactic Antibiotics: The patient does not                         require prophylactic antibiotics. Prior                         Anticoagulants:  The patient has taken no anticoagulant                         or antiplatelet agents. ASA Grade Assessment: III - A                         patient with severe systemic disease. After reviewing                         the risks and benefits, the patient was deemed in                         satisfactory condition to undergo the procedure. The                         anesthesia plan was to use monitored anesthesia care                         (MAC). Immediately prior to administration of                         medications, the patient was re-assessed for adequacy                         to receive sedatives. The heart rate, respiratory                         rate, oxygen saturations, blood pressure, adequacy of                         pulmonary ventilation, and response to care were                         monitored throughout the procedure. The physical                         status of the patient was re-assessed after the                         procedure.                        After obtaining informed consent, the colonoscope was                         passed under direct vision. Throughout the procedure,                         the patient's blood pressure, pulse, and oxygen  saturations were monitored continuously. The                         Colonoscope was introduced through the anus and                         advanced to the the cecum, identified by appendiceal                         orifice and ileocecal valve. The colonoscopy was                         performed without difficulty. The patient tolerated                         the procedure well. The quality of the bowel                         preparation was good. The ileocecal valve, appendiceal                         orifice, and rectum were photographed. Findings:      The perianal and digital rectal examinations were normal.      A 1 mm polyp was found in the transverse colon. The polyp was  sessile.       The polyp was removed with a jumbo cold forceps. Resection and retrieval       were complete. Estimated blood loss was minimal.      A 4 mm polyp was found in the distal transverse colon. The polyp was       sessile. The polyp was removed with a cold snare. Resection and       retrieval were complete. Estimated blood loss was minimal.      A few small-mouthed diverticula were found in the sigmoid colon.      Internal hemorrhoids were found during retroflexion. The hemorrhoids       were Grade I (internal hemorrhoids that do not prolapse).      The exam was otherwise without abnormality on direct and retroflexion       views. Impression:            - One 1 mm polyp in the transverse colon, removed with                         a jumbo cold forceps. Resected and retrieved.                        - One 4 mm polyp in the distal transverse colon,                         removed with a cold snare. Resected and retrieved.                        - Diverticulosis in the sigmoid colon.                        - Internal hemorrhoids.                        - The  examination was otherwise normal on direct and                         retroflexion views. Recommendation:        - Discharge patient to home.                        - Resume previous diet.                        - Continue present medications.                        - Await pathology results.                        - Repeat colonoscopy for surveillance based on                         pathology results.                        - Return to referring physician as previously                         scheduled. Procedure Code(s):     --- Professional ---                        [phone], Colonoscopy, flexible; with removal of                         tumor(s), polyp(s), or other lesion(s) by snare                         technique                        45380, 46, Colonoscopy, flexible; with biopsy, single                         or  multiple Diagnosis Code(s):     --- Professional ---                        Z86.010, Personal history of colonic polyps                        D12.3, Benign neoplasm of transverse colon (hepatic                         flexure or splenic flexure)                        K64.0, First degree hemorrhoids                        K57.30, Diverticulosis of large intestine without                         perforation or abscess without bleeding CPT copyright 2022 American Medical Association. All rights reserved. The codes documented in this report are preliminary and upon coder review may  be revised to meet current compliance requirements.  Andrey Farmer MD, MD 12/20/2022 10:22:45 AM Number of Addenda: 0 Note Initiated On: 12/20/2022 9:30 AM Scope Withdrawal Time: 0 hours 10 minutes 7 seconds  Total Procedure Duration: 0 hours 14 minutes 55 seconds  Estimated Blood Loss:  Estimated blood loss was minimal.      [facility]

## 2022-12-20 NOTE — Transfer of Care (Signed)
Immediate Anesthesia Transfer of Care Note  Patient: Adam Brewer  Procedure(s) Performed: COLONOSCOPY WITH PROPOFOL ESOPHAGOGASTRODUODENOSCOPY (EGD) WITH PROPOFOL  Patient Location: PACU  Anesthesia Type:General  Level of Consciousness: drowsy  Airway & Oxygen Therapy: Patient Spontanous Breathing and Patient connected to face mask oxygen  Post-op Assessment: Report given to RN and Post -op Vital signs reviewed and stable  Post vital signs: Reviewed and stable  Last Vitals:  Vitals Value Taken Time  BP    Temp    Pulse    Resp    SpO2      Last Pain:  Vitals:   12/20/22 0925  TempSrc: Temporal  PainSc: 0-No pain         Complications: No notable events documented.

## 2022-12-20 NOTE — Op Note (Signed)
[facility] Patient Name: Adam Brewer Procedure Date: 12/20/2022 9:32 AM MRN: [id number] Account #: [id number] Date of Birth: February 06, 1951 Admit Type: Outpatient Age: 72 Room: [facility] ENDO ROOM 1 Gender: Male Note Status: Finalized Instrument Name: Upper Endoscope [id number] Procedure:             Upper GI endoscopy Indications:           Dyspepsia Providers:             Andrey Farmer MD, MD Referring MD:          Philis Fendt PA Medicines:             Monitored Anesthesia Care Complications:         No immediate complications. Estimated blood loss:                         Minimal. Procedure:             Pre-Anesthesia Assessment:                        - Prior to the procedure, a History and Physical was                         performed, and patient medications and allergies were                         reviewed. The patient is competent. The risks and                         benefits of the procedure and the sedation options and                         risks were discussed with the patient. All questions                         were answered and informed consent was obtained.                         Patient identification and proposed procedure were                         verified by the physician, the nurse, the                         anesthesiologist, the anesthetist and the technician                         in the endoscopy suite. Mental Status Examination:                         alert and oriented. Airway Examination: normal                         oropharyngeal airway and neck mobility. Respiratory                         Examination: clear to auscultation. CV Examination:  normal. Prophylactic Antibiotics: The patient does not                         require prophylactic antibiotics. Prior                         Anticoagulants: The patient has taken no anticoagulant                         or antiplatelet agents.  ASA Grade Assessment: III - A                         patient with severe systemic disease. After reviewing                         the risks and benefits, the patient was deemed in                         satisfactory condition to undergo the procedure. The                         anesthesia plan was to use monitored anesthesia care                         (MAC). Immediately prior to administration of                         medications, the patient was re-assessed for adequacy                         to receive sedatives. The heart rate, respiratory                         rate, oxygen saturations, blood pressure, adequacy of                         pulmonary ventilation, and response to care were                         monitored throughout the procedure. The physical                         status of the patient was re-assessed after the                         procedure.                        After obtaining informed consent, the endoscope was                         passed under direct vision. Throughout the procedure,                         the patient's blood pressure, pulse, and oxygen                         saturations were monitored continuously. The Endoscope  was introduced through the mouth, and advanced to the                         second part of duodenum. The upper GI endoscopy was                         accomplished without difficulty. The patient tolerated                         the procedure well. Findings:      A 6 cm hiatal hernia was present.      The exam of the esophagus was otherwise normal.      Localized moderately erythematous mucosa without bleeding was found in       the gastric antrum. Biopsies were taken with a cold forceps for       histology. Estimated blood loss was minimal.      The exam of the stomach was otherwise normal.      The examined duodenum was normal. Impression:            - 6 cm hiatal hernia.                         - Erythematous mucosa in the antrum. Biopsied.                        - Normal examined duodenum. Recommendation:        - Await pathology results.                        - Perform a colonoscopy today. Procedure Code(s):     --- Professional ---                        [phone], Esophagogastroduodenoscopy, flexible,                         transoral; with biopsy, single or multiple Diagnosis Code(s):     --- Professional ---                        K44.9, Diaphragmatic hernia without obstruction or                         gangrene                        K31.89, Other diseases of stomach and duodenum                        R10.13, Epigastric pain CPT copyright 2022 American Medical Association. All rights reserved. The codes documented in this report are preliminary and upon coder review may  be revised to meet current compliance requirements. Andrey Farmer MD, MD 12/20/2022 10:19:53 AM Number of Addenda: 0 Note Initiated On: 12/20/2022 9:32 AM Estimated Blood Loss:  Estimated blood loss was minimal.      [facility]

## 2022-12-20 NOTE — Interval H&P Note (Signed)
History and Physical Interval Note:  12/20/2022 9:43 AM  Adam Brewer  has presented today for surgery, with the diagnosis of h/o ta polyps.  The various methods of treatment have been discussed with the patient and family. After consideration of risks, benefits and other options for treatment, the patient has consented to  Procedure(s): COLONOSCOPY WITH PROPOFOL (N/A) ESOPHAGOGASTRODUODENOSCOPY (EGD) WITH PROPOFOL (N/A) as a surgical intervention.  The patient's history has been reviewed, patient examined, no change in status, stable for surgery.  I have reviewed the patient's chart and labs.  Questions were answered to the patient's satisfaction.     Lesly Rubenstein  Ok to proceed with EGD/Colonoscopy

## 2022-12-20 NOTE — Anesthesia Preprocedure Evaluation (Addendum)
Anesthesia Evaluation  Patient identified by MRN, date of birth, ID band Patient awake    Reviewed: Allergy & Precautions, H&P , NPO status , Patient's Chart, lab work & pertinent test results  Airway Mallampati: III  TM Distance: >3 FB Neck ROM: full    Dental  (+) Missing, Upper Dentures   Pulmonary former smoker   Pulmonary exam normal        Cardiovascular Exercise Tolerance: Good negative cardio ROS Normal cardiovascular exam     Neuro/Psych negative neurological ROS  negative psych ROS   GI/Hepatic negative GI ROS, Neg liver ROS,,,  Endo/Other    Morbid obesity  Renal/GU negative Renal ROS  negative genitourinary   Musculoskeletal   Abdominal  (+) + obese  Peds  Hematology negative hematology ROS (+)   Anesthesia Other Findings Past Medical History: No date: High cholesterol No date: Seasonal allergies      Reproductive/Obstetrics negative OB ROS                             Anesthesia Physical Anesthesia Plan  ASA: 3  Anesthesia Plan: General   Post-op Pain Management:    Induction: Intravenous  PONV Risk Score and Plan: Propofol infusion and TIVA  Airway Management Planned: Natural Airway  Additional Equipment:   Intra-op Plan:   Post-operative Plan:   Informed Consent: I have reviewed the patients History and Physical, chart, labs and discussed the procedure including the risks, benefits and alternatives for the proposed anesthesia with the patient or authorized representative who has indicated his/her understanding and acceptance.     Dental Advisory Given  Plan Discussed with: CRNA and Surgeon  Anesthesia Plan Comments:        Anesthesia Quick Evaluation

## 2022-12-20 NOTE — H&P (Signed)
Outpatient short stay form Pre-procedure 12/20/2022  Adam Rubenstein, MD  Primary Physician: Tereasa Coop, PA-C  Reason for visit:  Dyspepsia/Surveillance colonoscopy  History of present illness:    72 y/o gentleman with history of obesity and alcohol abuse here for EGD for abdominal bloating and colonoscopy for surveillance. Last colonoscopy in 2019 with many polyps but only a few were Ta's. No blood thinners. No significant abdominal surgeries. Brother with colon cancer in 75's and father with stomach cancer in his 46's. He drinks 36 beers total over a weekend.   No current facility-administered medications for this encounter.  Medications Prior to Admission  Medication Sig Dispense Refill Last Dose   cyclobenzaprine (FLEXERIL) 10 MG tablet Take 1 tablet (10 mg total) by mouth 3 (three) times daily. 20 tablet 0    loratadine (CLARITIN) 10 MG tablet Take 10 mg by mouth daily.      naproxen (NAPROSYN) 500 MG tablet Take 1 tablet (500 mg total) by mouth 2 (two) times daily with a meal. 60 tablet 0    UNKNOWN TO PATIENT Cholesterol pill.  "Itty bitty pill"  possibly '5mg'$         No Known Allergies   Past Medical History:  Diagnosis Date   High cholesterol    Seasonal allergies     Review of systems:  Otherwise negative.    Physical Exam  Gen: Alert, oriented. Appears stated age.  HEENT: PERRLA. Lungs: No respiratory distress CV: RRR Abd: soft, benign, no masses Ext: No edema    Planned procedures: Proceed with EGD/colonoscopy. The patient understands the nature of the planned procedure, indications, risks, alternatives and potential complications including but not limited to bleeding, infection, perforation, damage to internal organs and possible oversedation/side effects from anesthesia. The patient agrees and gives consent to proceed.  Please refer to procedure notes for findings, recommendations and patient disposition/instructions.     Adam Rubenstein,  MD [facility]

## 2022-12-21 ENCOUNTER — Encounter: Payer: Self-pay | Admitting: Gastroenterology

## 2022-12-21 LAB — SURGICAL PATHOLOGY

## 2022-12-22 NOTE — Anesthesia Postprocedure Evaluation (Signed)
Anesthesia Post Note  Patient: Adam Brewer  Procedure(s) Performed: COLONOSCOPY WITH PROPOFOL ESOPHAGOGASTRODUODENOSCOPY (EGD) WITH PROPOFOL  Patient location during evaluation: Endoscopy Anesthesia Type: General Level of consciousness: awake and alert Pain management: pain level controlled Vital Signs Assessment: post-procedure vital signs reviewed and stable Respiratory status: spontaneous breathing, nonlabored ventilation and respiratory function stable Cardiovascular status: blood pressure returned to baseline and stable Postop Assessment: no apparent nausea or vomiting Anesthetic complications: no   No notable events documented.   Last Vitals:  Vitals:   12/20/22 1030 12/20/22 1040  BP: 115/68 131/78  Pulse: 76 77  Resp: 12 17  Temp:    SpO2: 94% 95%    Last Pain:  Vitals:   12/20/22 1040  TempSrc:   PainSc: 0-No pain                 [place] Ouch
